# Patient Record
Sex: Female | Born: 1985 | Race: Black or African American | Hispanic: No | Marital: Married | Smoking: Never smoker
Health system: Southern US, Community
[De-identification: ages and names within clinical notes are randomized; demographics above are authoritative.]

## PROBLEM LIST (undated history)

## (undated) DIAGNOSIS — Z789 Other specified health status: Secondary | ICD-10-CM

## (undated) HISTORY — PX: NO PAST SURGERIES: SHX2092

## (undated) HISTORY — PX: WISDOM TOOTH EXTRACTION: SHX21

## (undated) HISTORY — DX: Other specified health status: Z78.9

---

## 2005-01-12 ENCOUNTER — Emergency Department (HOSPITAL_COMMUNITY): Admission: EM | Admit: 2005-01-12 | Discharge: 2005-01-12 | Payer: Self-pay | Admitting: Emergency Medicine

## 2005-08-16 ENCOUNTER — Emergency Department (HOSPITAL_COMMUNITY): Admission: EM | Admit: 2005-08-16 | Discharge: 2005-08-17 | Payer: Self-pay | Admitting: Emergency Medicine

## 2005-12-31 ENCOUNTER — Emergency Department (HOSPITAL_COMMUNITY): Admission: EM | Admit: 2005-12-31 | Discharge: 2005-12-31 | Payer: Self-pay | Admitting: Family Medicine

## 2006-05-23 IMAGING — US US TRANSVAGINAL NON-OB
1 series · 14 of 25 positions shown · non-contrast
Comparison: none

CLINICAL DATA: Pelvic pain.
 TRANSABDOMINAL AND TRANSVAGINAL PELVIC ULTRASOUND:
TECHNIQUE: Both transabdominal and transvaginal ultrasound examinations of the pelvis were performed including evaluation of the uterus, ovaries, adnexal regions, and pelvic cul-de-sac.

[Series 1: unknown · 0.28mm/px · 14 of 49 slices shown]
[im 1/49]
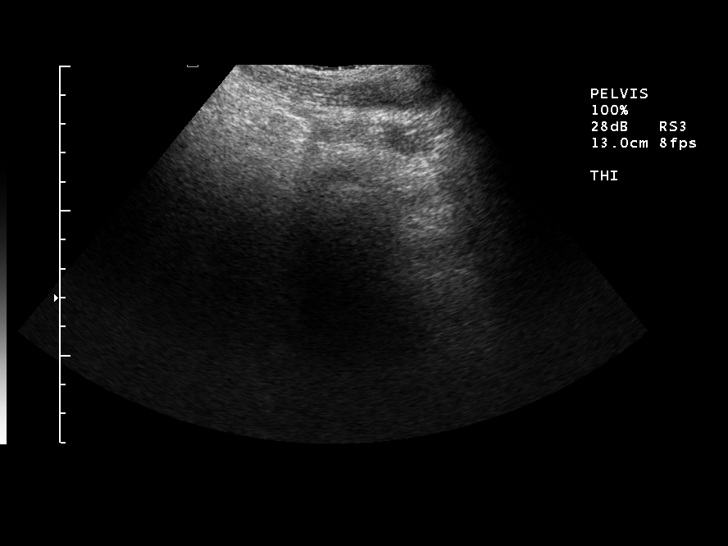
[im 5/49]
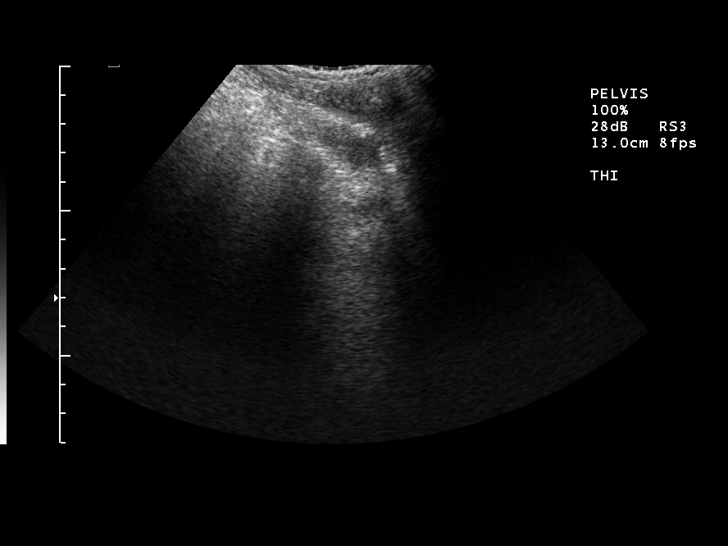
[im 9/49]
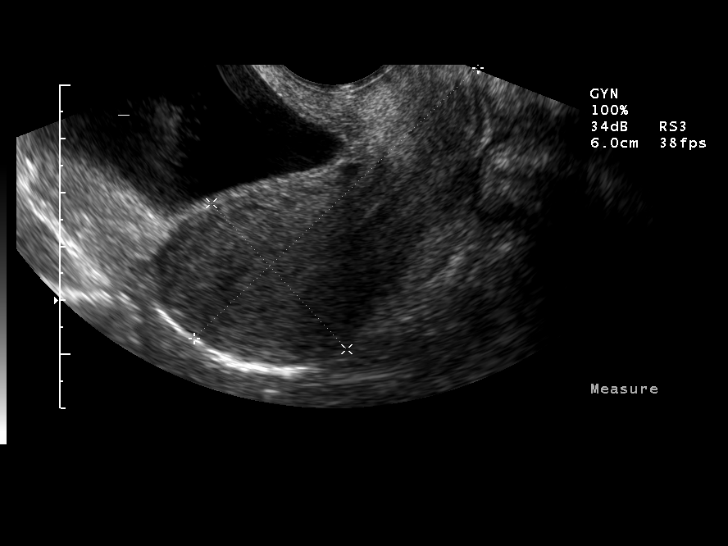
[im 13/49]
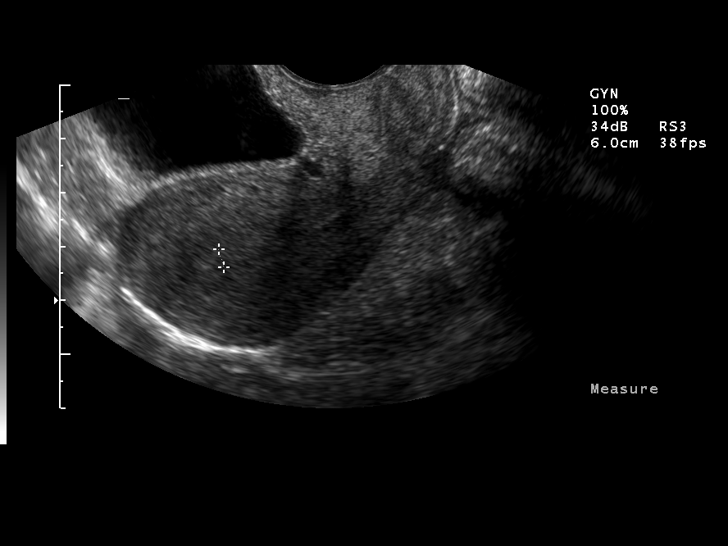
[im 17/49]
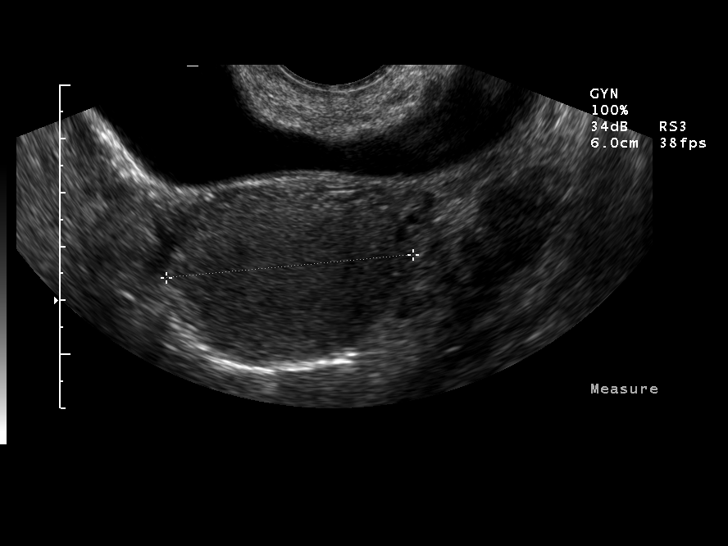
[im 19/49]
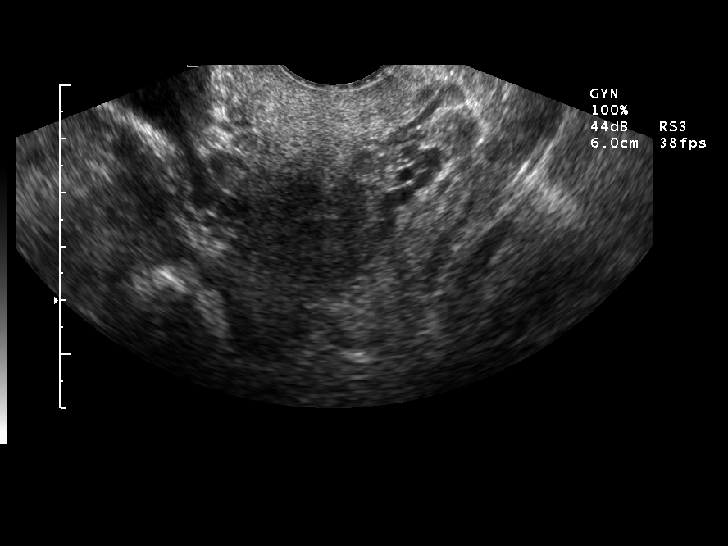
[im 23/49]
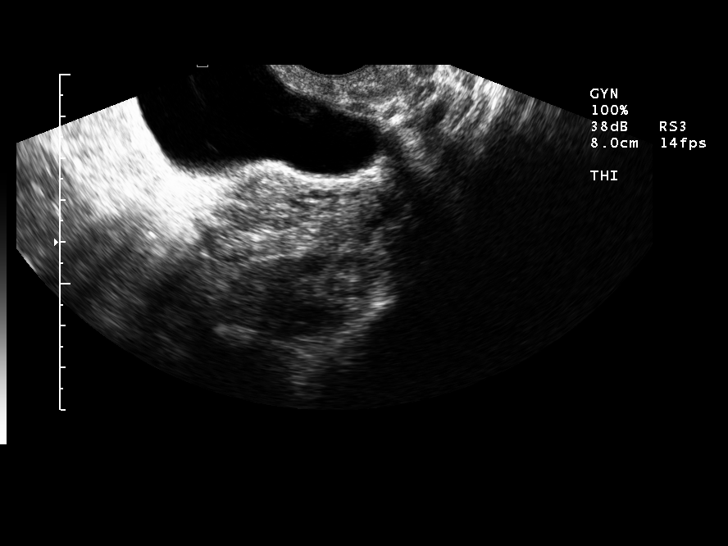
[im 27/49]
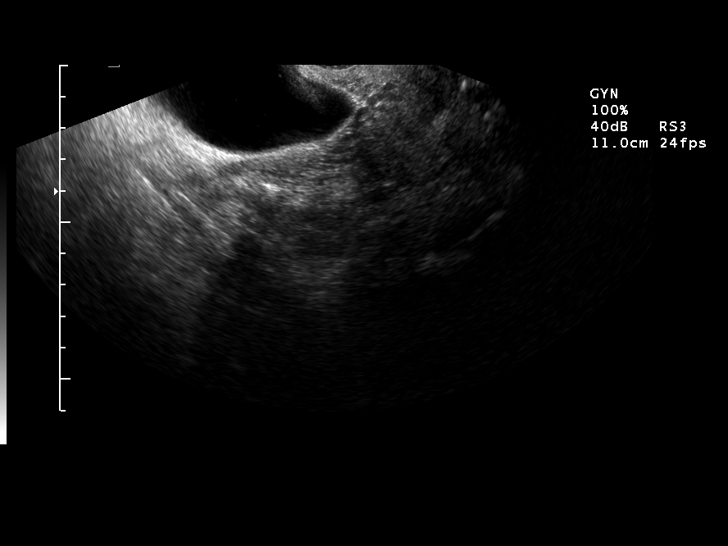
[im 31/49]
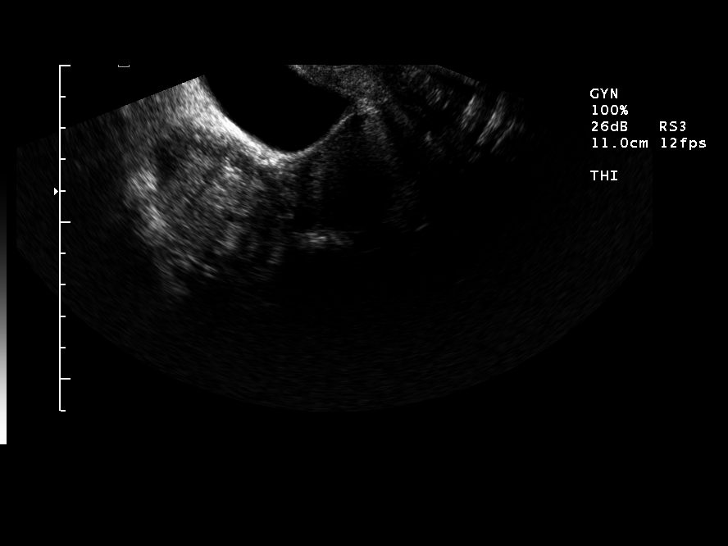
[im 33/49]
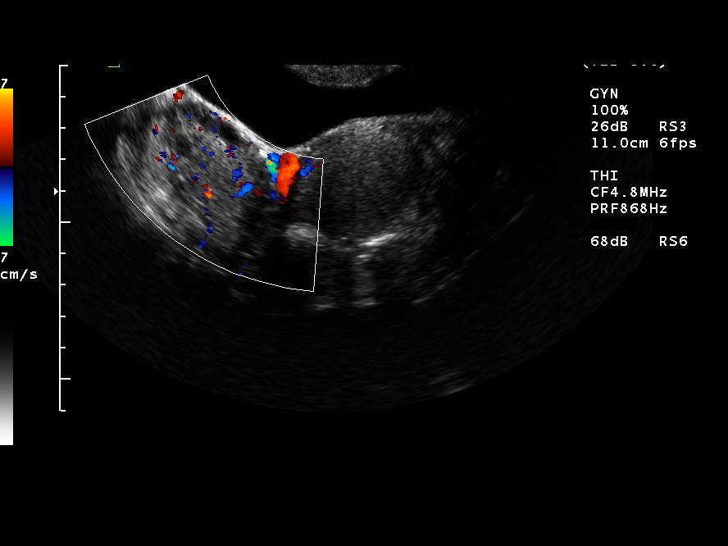
[im 37/49]
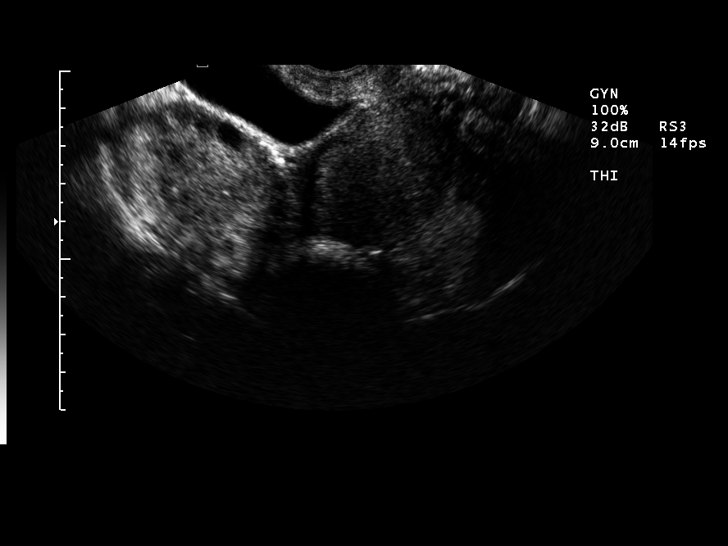
[im 41/49]
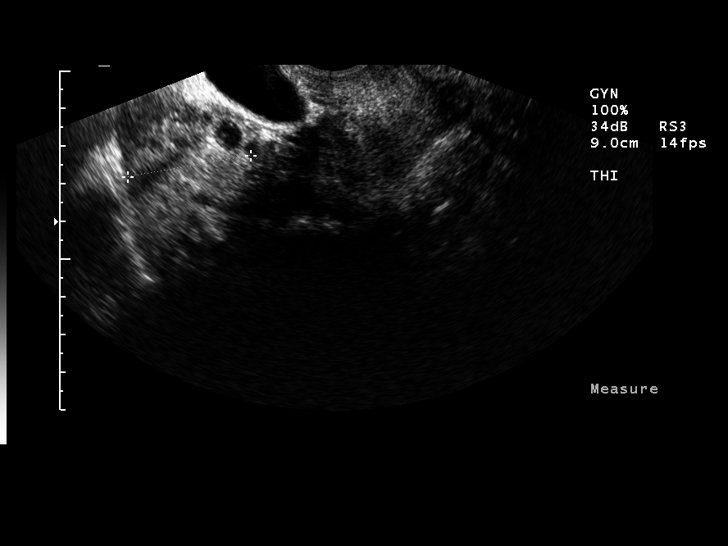
[im 45/49]
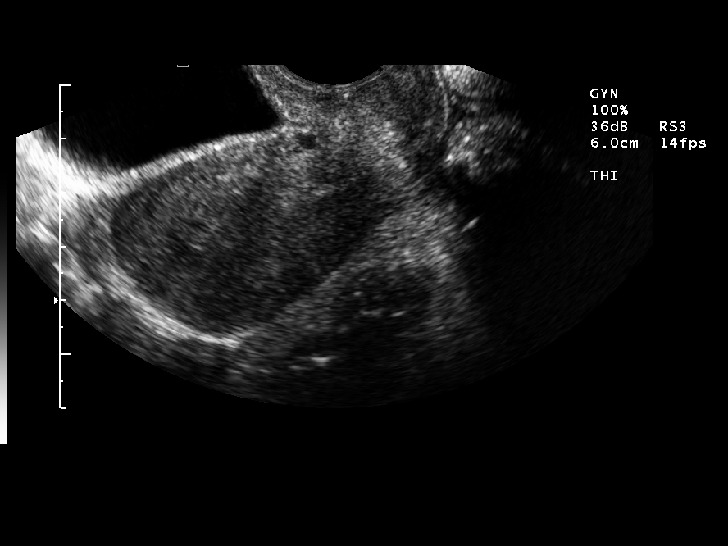
[im 49/49]
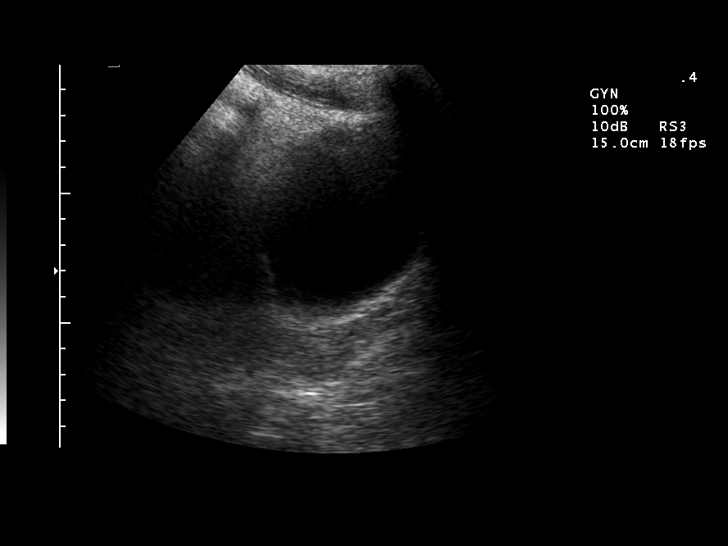

[14 of 25 positions shown; findings below may reference images not displayed]

FINDINGS: The uterus is within normal limits.  The endometrial stripe is 4 mm in caliber.  The ovaries are within normal limits without evidence of abnormal cyst.  Negative free fluid.  No definite hydrosalpinx is present.
IMPRESSION: Pelvic examination is within normal limits.  The abnormal noted on CT in the right adnexa is not appreciated.  It probably does not represent a hydrosalpinx.  However, loculated fluid secondary to other causes are a consideration.  This would include focal abscess or bladder diverticulum.  MRI may be helpful.

## 2007-02-28 ENCOUNTER — Emergency Department (HOSPITAL_COMMUNITY): Admission: EM | Admit: 2007-02-28 | Discharge: 2007-02-28 | Payer: Self-pay | Admitting: Family Medicine

## 2011-11-02 ENCOUNTER — Ambulatory Visit (INDEPENDENT_AMBULATORY_CARE_PROVIDER_SITE_OTHER): Payer: No Typology Code available for payment source

## 2011-11-02 DIAGNOSIS — L738 Other specified follicular disorders: Secondary | ICD-10-CM

## 2011-11-02 DIAGNOSIS — H6500 Acute serous otitis media, unspecified ear: Secondary | ICD-10-CM

## 2011-11-02 DIAGNOSIS — R599 Enlarged lymph nodes, unspecified: Secondary | ICD-10-CM

## 2013-10-18 NOTE — L&D Delivery Note (Signed)
SVD of VFI at 1915 on 09/30/14.  EBL 350cc.  APGARs 8,9.  Placenta to L&D. Head delivered LOA and body followed atraumatically.  Cord was clamped, cut, and baby to abdomen.  Cord blood was obtained.  Placenta delivered S/I/3VC.  Fundus was firmed with pitocin and massage.  Vaginal laceration was repaired with 3-0 Rapide in the normal fashion.  Mom and baby stable.    Mitchel HonourMegan Celso Granja, DO

## 2014-03-26 LAB — OB RESULTS CONSOLE RPR: RPR: NONREACTIVE

## 2014-03-26 LAB — OB RESULTS CONSOLE RUBELLA ANTIBODY, IGM: Rubella: IMMUNE

## 2014-03-26 LAB — OB RESULTS CONSOLE ABO/RH: RH Type: POSITIVE

## 2014-03-26 LAB — OB RESULTS CONSOLE HEPATITIS B SURFACE ANTIGEN: Hepatitis B Surface Ag: NEGATIVE

## 2014-03-26 LAB — OB RESULTS CONSOLE ANTIBODY SCREEN: ANTIBODY SCREEN: NEGATIVE

## 2014-03-26 LAB — OB RESULTS CONSOLE HIV ANTIBODY (ROUTINE TESTING): HIV: NONREACTIVE

## 2014-04-04 LAB — OB RESULTS CONSOLE GC/CHLAMYDIA
Chlamydia: NEGATIVE
GC PROBE AMP, GENITAL: NEGATIVE

## 2014-09-04 ENCOUNTER — Ambulatory Visit (HOSPITAL_COMMUNITY): Payer: 59

## 2014-09-10 LAB — OB RESULTS CONSOLE GBS: GBS: NEGATIVE

## 2014-09-18 ENCOUNTER — Ambulatory Visit (HOSPITAL_COMMUNITY): Payer: 59

## 2014-09-29 ENCOUNTER — Inpatient Hospital Stay (HOSPITAL_COMMUNITY)
Admission: AD | Admit: 2014-09-29 | Discharge: 2014-09-29 | Disposition: A | Discharge status: Home / Self Care | Payer: 59 | Source: Ambulatory Visit | Attending: Obstetrics and Gynecology | Admitting: Obstetrics and Gynecology

## 2014-09-29 ENCOUNTER — Encounter (HOSPITAL_COMMUNITY): Payer: Self-pay | Admitting: *Deleted

## 2014-09-29 ENCOUNTER — Inpatient Hospital Stay (HOSPITAL_COMMUNITY): Payer: 59

## 2014-09-29 DIAGNOSIS — IMO0002 Reserved for concepts with insufficient information to code with codable children: Secondary | ICD-10-CM

## 2014-09-29 DIAGNOSIS — Z3A37 37 weeks gestation of pregnancy: Secondary | ICD-10-CM | POA: Diagnosis not present

## 2014-09-29 DIAGNOSIS — R109 Unspecified abdominal pain: Secondary | ICD-10-CM | POA: Insufficient documentation

## 2014-09-29 DIAGNOSIS — O9989 Other specified diseases and conditions complicating pregnancy, childbirth and the puerperium: Secondary | ICD-10-CM | POA: Diagnosis present

## 2014-09-29 NOTE — Discharge Instructions (Signed)
Braxton Hicks Contractions °Contractions of the uterus can occur throughout pregnancy. Contractions are not always a sign that you are in labor.  °WHAT ARE BRAXTON HICKS CONTRACTIONS?  °Contractions that occur before labor are called Braxton Hicks contractions, or false labor. Toward the end of pregnancy (32-34 weeks), these contractions can develop more often and may become more forceful. This is not true labor because these contractions do not result in opening (dilatation) and thinning of the cervix. They are sometimes difficult to tell apart from true labor because these contractions can be forceful and people have different pain tolerances. You should not feel embarrassed if you go to the hospital with false labor. Sometimes, the only way to tell if you are in true labor is for your health care provider to look for changes in the cervix. °If there are no prenatal problems or other health problems associated with the pregnancy, it is completely safe to be sent home with false labor and await the onset of true labor. °HOW CAN YOU TELL THE DIFFERENCE BETWEEN TRUE AND FALSE LABOR? °False Labor °· The contractions of false labor are usually shorter and not as hard as those of true labor.   °· The contractions are usually irregular.   °· The contractions are often felt in the front of the lower abdomen and in the groin.   °· The contractions may go away when you walk around or change positions while lying down.   °· The contractions get weaker and are shorter lasting as time goes on.   °· The contractions do not usually become progressively stronger, regular, and closer together as with true labor.   °True Labor °· Contractions in true labor last 30-70 seconds, become very regular, usually become more intense, and increase in frequency.   °· The contractions do not go away with walking.   °· The discomfort is usually felt in the top of the uterus and spreads to the lower abdomen and low back.   °· True labor can be  determined by your health care provider with an exam. This will show that the cervix is dilating and getting thinner.   °WHAT TO REMEMBER °· Keep up with your usual exercises and follow other instructions given by your health care provider.   °· Take medicines as directed by your health care provider.   °· Keep your regular prenatal appointments.   °· Eat and drink lightly if you think you are going into labor.   °· If Braxton Hicks contractions are making you uncomfortable:   °¨ Change your position from lying down or resting to walking, or from walking to resting.   °¨ Sit and rest in a tub of warm water.   °¨ Drink 2-3 glasses of water. Dehydration may cause these contractions.   °¨ Do slow and deep breathing several times an hour.   °WHEN SHOULD I SEEK IMMEDIATE MEDICAL CARE? °Seek immediate medical care if: °· Your contractions become stronger, more regular, and closer together.   °· You have fluid leaking or gushing from your vagina.   °· You have a fever.   °· You pass blood-tinged mucus.   °· You have vaginal bleeding.   °· You have continuous abdominal pain.   °· You have low back pain that you never had before.   °· You feel your baby's head pushing down and causing pelvic pressure.   °· Your baby is not moving as much as it used to.   °Document Released: 10/04/2005 Document Revised: 10/09/2013 Document Reviewed: 07/16/2013 °[employer]® Patient Information ©2015 [employer]. This information is not intended to replace advice given to you by your health care   provider. Make sure you discuss any questions you have with your health care provider. ° °

## 2014-09-29 NOTE — MAU Note (Signed)
Pt reports what she states feels like cramping that comes and goes. Denies bleeding or ROM

## 2014-09-30 ENCOUNTER — Inpatient Hospital Stay (HOSPITAL_COMMUNITY)
Admission: AD | Admit: 2014-09-30 | Discharge: 2014-10-02 | DRG: 775 | Disposition: A | Discharge status: Home / Self Care | Payer: 59 | Source: Ambulatory Visit | Attending: Obstetrics & Gynecology | Admitting: Obstetrics & Gynecology

## 2014-09-30 ENCOUNTER — Inpatient Hospital Stay (HOSPITAL_COMMUNITY): Payer: 59 | Admitting: Anesthesiology

## 2014-09-30 ENCOUNTER — Encounter (HOSPITAL_COMMUNITY): Payer: Self-pay

## 2014-09-30 DIAGNOSIS — Z3490 Encounter for supervision of normal pregnancy, unspecified, unspecified trimester: Secondary | ICD-10-CM

## 2014-09-30 DIAGNOSIS — Z3A38 38 weeks gestation of pregnancy: Secondary | ICD-10-CM | POA: Diagnosis present

## 2014-09-30 DIAGNOSIS — Z3403 Encounter for supervision of normal first pregnancy, third trimester: Secondary | ICD-10-CM | POA: Diagnosis present

## 2014-09-30 LAB — TYPE AND SCREEN
ABO/RH(D): O POS
ANTIBODY SCREEN: NEGATIVE

## 2014-09-30 LAB — CBC
HCT: 37.3 % (ref 36.0–46.0)
Hemoglobin: 12.2 g/dL (ref 12.0–15.0)
MCH: 29.3 pg (ref 26.0–34.0)
MCHC: 32.7 g/dL (ref 30.0–36.0)
MCV: 89.7 fL (ref 78.0–100.0)
PLATELETS: 272 10*3/uL (ref 150–400)
RBC: 4.16 MIL/uL (ref 3.87–5.11)
RDW: 13.5 % (ref 11.5–15.5)
WBC: 20.3 10*3/uL — ABNORMAL HIGH (ref 4.0–10.5)

## 2014-09-30 LAB — RPR

## 2014-09-30 LAB — ABO/RH: ABO/RH(D): O POS

## 2014-09-30 MED ORDER — SIMETHICONE 80 MG PO CHEW: 80.0000 mg | CHEWABLE_TABLET | ORAL | Status: DC | PRN

## 2014-09-30 MED ORDER — ONDANSETRON HCL 4 MG/2ML IJ SOLN: 4.0000 mg | Freq: Four times a day (QID) | INTRAMUSCULAR | Status: DC | PRN

## 2014-09-30 MED ORDER — PHENYLEPHRINE 40 MCG/ML (10ML) SYRINGE FOR IV PUSH (FOR BLOOD PRESSURE SUPPORT)
PREFILLED_SYRINGE | INTRAVENOUS | Status: AC
  Filled 2014-09-30: qty 10

## 2014-09-30 MED ORDER — WITCH HAZEL-GLYCERIN EX PADS: 1.0000 "application " | MEDICATED_PAD | CUTANEOUS | Status: DC | PRN

## 2014-09-30 MED ORDER — TETANUS-DIPHTH-ACELL PERTUSSIS 5-2.5-18.5 LF-MCG/0.5 IM SUSP: 0.5000 mL | Freq: Once | INTRAMUSCULAR | Status: DC

## 2014-09-30 MED ORDER — PRENATAL MULTIVITAMIN CH
1.0000 | ORAL_TABLET | Freq: Every day | ORAL | Status: DC
  Administered 2014-10-01 – 2014-10-02 (×2): 1 via ORAL
  Filled 2014-09-30 (×2): qty 1

## 2014-09-30 MED ORDER — OXYTOCIN BOLUS FROM INFUSION: 500.0000 mL | INTRAVENOUS | Status: DC

## 2014-09-30 MED ORDER — FLEET ENEMA 7-19 GM/118ML RE ENEM: 1.0000 | ENEMA | RECTAL | Status: DC | PRN

## 2014-09-30 MED ORDER — LACTATED RINGERS IV SOLN
500.0000 mL | Freq: Once | INTRAVENOUS | Status: AC
  Administered 2014-09-30: 500 mL via INTRAVENOUS

## 2014-09-30 MED ORDER — LACTATED RINGERS IV SOLN: INTRAVENOUS | Status: DC

## 2014-09-30 MED ORDER — PHENYLEPHRINE 40 MCG/ML (10ML) SYRINGE FOR IV PUSH (FOR BLOOD PRESSURE SUPPORT)
80.0000 ug | PREFILLED_SYRINGE | INTRAVENOUS | Status: DC | PRN
  Filled 2014-09-30: qty 2

## 2014-09-30 MED ORDER — OXYCODONE-ACETAMINOPHEN 5-325 MG PO TABS: 2.0000 | ORAL_TABLET | ORAL | Status: DC | PRN

## 2014-09-30 MED ORDER — LACTATED RINGERS IV SOLN: 500.0000 mL | INTRAVENOUS | Status: DC | PRN

## 2014-09-30 MED ORDER — ZOLPIDEM TARTRATE 5 MG PO TABS: 5.0000 mg | ORAL_TABLET | Freq: Every evening | ORAL | Status: DC | PRN

## 2014-09-30 MED ORDER — OXYCODONE-ACETAMINOPHEN 5-325 MG PO TABS
1.0000 | ORAL_TABLET | ORAL | Status: DC | PRN
  Administered 2014-10-01 – 2014-10-02 (×4): 1 via ORAL
  Filled 2014-09-30 (×4): qty 1

## 2014-09-30 MED ORDER — OXYCODONE-ACETAMINOPHEN 5-325 MG PO TABS: 1.0000 | ORAL_TABLET | ORAL | Status: DC | PRN

## 2014-09-30 MED ORDER — LIDOCAINE HCL (PF) 1 % IJ SOLN: 30.0000 mL | INTRAMUSCULAR | Status: DC | PRN

## 2014-09-30 MED ORDER — OXYTOCIN BOLUS FROM INFUSION
500.0000 mL | INTRAVENOUS | Status: DC
  Administered 2014-09-30: 500 mL via INTRAVENOUS

## 2014-09-30 MED ORDER — LANOLIN HYDROUS EX OINT: TOPICAL_OINTMENT | CUTANEOUS | Status: DC | PRN

## 2014-09-30 MED ORDER — EPHEDRINE 5 MG/ML INJ
10.0000 mg | INTRAVENOUS | Status: DC | PRN
  Filled 2014-09-30: qty 2

## 2014-09-30 MED ORDER — ONDANSETRON HCL 4 MG/2ML IJ SOLN: 4.0000 mg | INTRAMUSCULAR | Status: DC | PRN

## 2014-09-30 MED ORDER — DIBUCAINE 1 % RE OINT: 1.0000 "application " | TOPICAL_OINTMENT | RECTAL | Status: DC | PRN

## 2014-09-30 MED ORDER — DIPHENHYDRAMINE HCL 50 MG/ML IJ SOLN: 12.5000 mg | INTRAMUSCULAR | Status: DC | PRN

## 2014-09-30 MED ORDER — OXYTOCIN 40 UNITS IN LACTATED RINGERS INFUSION - SIMPLE MED: 62.5000 mL/h | INTRAVENOUS | Status: DC

## 2014-09-30 MED ORDER — ONDANSETRON HCL 4 MG PO TABS: 4.0000 mg | ORAL_TABLET | ORAL | Status: DC | PRN

## 2014-09-30 MED ORDER — LIDOCAINE HCL (PF) 1 % IJ SOLN
INTRAMUSCULAR | Status: DC | PRN
  Administered 2014-09-30 (×2): 5 mL

## 2014-09-30 MED ORDER — DIPHENHYDRAMINE HCL 25 MG PO CAPS: 25.0000 mg | ORAL_CAPSULE | Freq: Four times a day (QID) | ORAL | Status: DC | PRN

## 2014-09-30 MED ORDER — FENTANYL 2.5 MCG/ML BUPIVACAINE 1/10 % EPIDURAL INFUSION (WH - ANES)
14.0000 mL/h | INTRAMUSCULAR | Status: DC | PRN
  Administered 2014-09-30 (×2): 14 mL/h via EPIDURAL
  Filled 2014-09-30: qty 125

## 2014-09-30 MED ORDER — SENNOSIDES-DOCUSATE SODIUM 8.6-50 MG PO TABS
2.0000 | ORAL_TABLET | ORAL | Status: DC
  Administered 2014-10-01 (×2): 2 via ORAL
  Filled 2014-09-30 (×2): qty 2

## 2014-09-30 MED ORDER — TERBUTALINE SULFATE 1 MG/ML IJ SOLN: 0.2500 mg | Freq: Once | INTRAMUSCULAR | Status: DC | PRN

## 2014-09-30 MED ORDER — EPHEDRINE 5 MG/ML INJ: 10.0000 mg | INTRAVENOUS | Status: DC | PRN

## 2014-09-30 MED ORDER — LACTATED RINGERS IV SOLN
INTRAVENOUS | Status: DC
  Administered 2014-09-30 (×2): 125 mL/h via INTRAVENOUS

## 2014-09-30 MED ORDER — OXYCODONE-ACETAMINOPHEN 5-325 MG PO TABS
2.0000 | ORAL_TABLET | ORAL | Status: DC | PRN
Start: 2014-09-30 — End: 2014-09-30

## 2014-09-30 MED ORDER — OXYTOCIN 40 UNITS IN LACTATED RINGERS INFUSION - SIMPLE MED
1.0000 m[IU]/min | INTRAVENOUS | Status: DC
  Administered 2014-09-30: 2 m[IU]/min via INTRAVENOUS
  Filled 2014-09-30: qty 1000

## 2014-09-30 MED ORDER — CITRIC ACID-SODIUM CITRATE 334-500 MG/5ML PO SOLN: 30.0000 mL | ORAL | Status: DC | PRN

## 2014-09-30 MED ORDER — IBUPROFEN 600 MG PO TABS
600.0000 mg | ORAL_TABLET | Freq: Four times a day (QID) | ORAL | Status: DC
  Administered 2014-10-01 – 2014-10-02 (×7): 600 mg via ORAL
  Filled 2014-09-30 (×7): qty 1

## 2014-09-30 MED ORDER — FENTANYL 2.5 MCG/ML BUPIVACAINE 1/10 % EPIDURAL INFUSION (WH - ANES)
INTRAMUSCULAR | Status: AC
  Administered 2014-09-30: 14 mL/h via EPIDURAL
  Filled 2014-09-30: qty 125

## 2014-09-30 MED ORDER — ACETAMINOPHEN 325 MG PO TABS: 650.0000 mg | ORAL_TABLET | ORAL | Status: DC | PRN

## 2014-09-30 MED ORDER — LIDOCAINE HCL (PF) 1 % IJ SOLN
30.0000 mL | INTRAMUSCULAR | Status: DC | PRN
  Filled 2014-09-30: qty 30

## 2014-09-30 MED ORDER — FENTANYL 2.5 MCG/ML BUPIVACAINE 1/10 % EPIDURAL INFUSION (WH - ANES): 14.0000 mL/h | INTRAMUSCULAR | Status: DC | PRN

## 2014-09-30 MED ORDER — BENZOCAINE-MENTHOL 20-0.5 % EX AERO
1.0000 "application " | INHALATION_SPRAY | CUTANEOUS | Status: DC | PRN
  Administered 2014-10-01: 1 via TOPICAL
  Filled 2014-09-30: qty 56

## 2014-09-30 MED ORDER — PHENYLEPHRINE 40 MCG/ML (10ML) SYRINGE FOR IV PUSH (FOR BLOOD PRESSURE SUPPORT): 80.0000 ug | PREFILLED_SYRINGE | INTRAVENOUS | Status: DC | PRN

## 2014-09-30 NOTE — Plan of Care (Signed)
Problem: Consults Goal: Birthing Suites Patient Information Press F2 to bring up selections list  Outcome: Completed/Met Date Met:  09/30/14  Pt 37-[redacted] weeks EGA  Problem: Phase I Progression Outcomes Goal: Pain controlled with appropriate interventions Outcome: Completed/Met Date Met:  09/30/14 Epidural in place Goal: Tolerating diet Outcome: Completed/Met Date Met:  09/30/14 Clear Liquid

## 2014-09-30 NOTE — Anesthesia Preprocedure Evaluation (Signed)

## 2014-09-30 NOTE — Anesthesia Procedure Notes (Signed)
Epidural Patient location during procedure: OB Start time: 09/30/2014 10:07 AM  Staffing Anesthesiologist: Brayton CavesJACKSON, Tesslyn Baumert Performed by: anesthesiologist   Preanesthetic Checklist Completed: patient identified, site marked, surgical consent, pre-op evaluation, timeout performed, IV checked, risks and benefits discussed and monitors and equipment checked  Epidural Patient position: sitting Prep: site prepped and draped and DuraPrep Patient monitoring: continuous pulse ox and blood pressure Approach: midline Location: L2-L3 Injection technique: LOR air  Needle:  Needle type: Tuohy  Needle gauge: 17 G Needle length: 9 cm and 9 Needle insertion depth: 5 cm cm Catheter type: closed end flexible Catheter size: 19 Gauge Catheter at skin depth: 10 cm Test dose: negative  Assessment Events: blood not aspirated, injection not painful, no injection resistance, negative IV test and no paresthesia  Additional Notes Patient identified.  Risk benefits discussed including failed block, incomplete pain control, headache, nerve damage, paralysis, blood pressure changes, nausea, vomiting, reactions to medication both toxic or allergic, and postpartum back pain.  Patient expressed understanding and wished to proceed.  All questions were answered.  Sterile technique used throughout procedure and epidural site dressed with sterile barrier dressing. No paresthesia or other complications noted.The patient did not experience any signs of intravascular injection such as tinnitus or metallic taste in mouth nor signs of intrathecal spread such as rapid motor block. Please see nursing notes for vital signs.

## 2014-09-30 NOTE — MAU Note (Signed)
Pt. Complaint of ctx since 0200 this am. States ctx are every 5 mins.

## 2014-09-30 NOTE — Progress Notes (Signed)
RN notified Anesthesia of patient's continuing numbness. Anesthesia relayed the need to keep patient on L&D while allowing her more time to regain feeling.

## 2014-09-30 NOTE — H&P (Signed)
Janet Hood is a 28 y.o. female presenting for labor.  CTX started at 2am.  No LOF.  +FM.  Antepartum course uncomplicated.    Maternal Medical History:  Reason for admission: Contractions.   Contractions: Onset was 6-12 hours ago.   Frequency: regular.   Perceived severity is moderate.    Fetal activity: Perceived fetal activity is normal.   Last perceived fetal movement was within the past hour.    Prenatal complications: no prenatal complications Prenatal Complications - Diabetes: none.    OB History    Gravida Para Term Preterm AB TAB SAB Ectopic Multiple Living   1 0 0 0 0 0 0 0 0 0      History reviewed. No pertinent past medical history. Past Surgical History  Procedure Laterality Date  . Wisdom tooth extraction    . No past surgeries     Family History: family history is not on file. Social History:  reports that she has never smoked. She does not have any smokeless tobacco history on file. She reports that she drinks about 0.6 oz of alcohol per week. She reports that she does not use illicit drugs.   Prenatal Transfer Tool  Maternal Diabetes: No Genetic Screening: Normal Maternal Ultrasounds/Referrals: Normal Fetal Ultrasounds or other Referrals:  None Maternal Substance Abuse:  No Significant Maternal Medications:  None Significant Maternal Lab Results:  Lab values include: Group B Strep negative Other Comments:  None  ROS  Dilation: 6 Effacement (%): 100 Station: -2 Exam by:: Amy Beard RNC-OB, BSN Blood pressure 113/73, pulse 79, temperature 97.6 F (36.4 C), temperature source Oral, resp. rate 18. Maternal Exam:  Uterine Assessment: Contraction strength is moderate.  Contraction frequency is regular.   Abdomen: Patient reports no abdominal tenderness. Fundal height is c/w dates.   Estimated fetal weight is 7#4.       Physical Exam  Constitutional: She is oriented to person, place, and time. She appears well-developed and well-nourished.  GI:  Soft. There is no rebound and no guarding.  Neurological: She is alert and oriented to person, place, and time.  Skin: Skin is warm and dry.  Psychiatric: She has a normal mood and affect. Her behavior is normal.    Prenatal labs: ABO, Rh: O/Positive/-- (06/09 0000) Antibody: Negative (06/09 0000) Rubella: Immune (06/09 0000) RPR: Nonreactive (06/09 0000)  HBsAg: Negative (06/09 0000)  HIV: Non-reactive (06/09 0000)  GBS: Negative (11/24 0000)   Assessment/Plan: 28yo G1 at 4675w0d with labor -Anticipate NSVD -Epidural   Lovina Zuver 09/30/2014, 9:32 AM

## 2014-09-30 NOTE — Progress Notes (Signed)
Dr Ellyn HackBovard notified of pt's VE, contraction pattern, and FHR, orders received

## 2014-09-30 NOTE — Progress Notes (Signed)
RN updated Anesthesia of patient's numbness - RN reported patient improvement. [facility]esthesia reported feeling [facility]

## 2014-10-01 LAB — CBC
HCT: 32.6 % — ABNORMAL LOW (ref 36.0–46.0)
Hemoglobin: 10.5 g/dL — ABNORMAL LOW (ref 12.0–15.0)
MCH: 28.9 pg (ref 26.0–34.0)
MCHC: 32.2 g/dL (ref 30.0–36.0)
MCV: 89.8 fL (ref 78.0–100.0)
Platelets: 240 10*3/uL (ref 150–400)
RBC: 3.63 MIL/uL — AB (ref 3.87–5.11)
RDW: 13.7 % (ref 11.5–15.5)
WBC: 19.6 10*3/uL — ABNORMAL HIGH (ref 4.0–10.5)

## 2014-10-01 MED ORDER — INFLUENZA VAC SPLIT QUAD 0.5 ML IM SUSY: 0.5000 mL | PREFILLED_SYRINGE | INTRAMUSCULAR | Status: DC

## 2014-10-01 NOTE — Lactation Note (Signed)
This note was copied from the chart of Janet Shalana Ruhlman. Lactation Consultation Note  Consult attempted on this first time BF mother.  She told me that BF was going well and that the baby didn't like"it".  She mentioned how sleepy the baby was and that the nurse had been assisting her.  I reassured her that the baby's behavior was WNL.  I explained to her that if her labor was Bertone that this could affect how alert her baby was.  She stated understanding.  She will call for help if she desires it.   Patient Name: Janet Hood Today's Date: 10/01/2014     Maternal Data    Feeding    LATCH Score/Interventions Latch: Too sleepy or reluctant, no latch achieved, no sucking elicited.                    Lactation Tools Discussed/Used     Consult Status      Soyla DryerJoseph, Arantxa Piercey 10/01/2014, 5:27 PM

## 2014-10-01 NOTE — Progress Notes (Cosign Needed)
Post Partum Day 1 Subjective: no complaints, up ad lib, voiding, tolerating PO and + flatus  Objective: Blood pressure 103/61, pulse 74, temperature 98.2 F (36.8 C), temperature source Oral, resp. rate 18, height 5\' 9"  (1.753 m), weight 155 lb (70.308 kg), SpO2 75 %, unknown if currently breastfeeding.  Physical Exam:  General: alert and cooperative Lochia: appropriate Uterine Fundus: firm Incision: healing well DVT Evaluation: No evidence of DVT seen on physical exam. Negative Homan's sign. No cords or calf tenderness. No significant calf/ankle edema.   Recent Labs  09/30/14 0900 10/01/14 0530  HGB 12.2 10.5*  HCT 37.3 32.6*    Assessment/Plan: Plan for discharge tomorrow   LOS: 1 day   Janet Hood G 10/01/2014, 8:15 AM

## 2014-10-01 NOTE — Anesthesia Postprocedure Evaluation (Signed)
Anesthesia Post Note  Patient: Janet Hood  Procedure(s) Performed: * No procedures listed *  Anesthesia type: Epidural  Patient location: Mother/Baby  Post pain: Pain level controlled  Post assessment: Post-op Vital signs reviewed  Last Vitals:  Filed Vitals:   10/01/14 0502  BP: 103/61  Pulse: 74  Temp: 36.8 C  Resp: 18    Post vital signs: Reviewed  Level of consciousness:alert  Complications: No apparent anesthesia complications

## 2014-10-02 MED ORDER — OXYCODONE-ACETAMINOPHEN 5-325 MG PO TABS: 1.0000 | ORAL_TABLET | ORAL | Status: DC | PRN

## 2014-10-02 MED ORDER — IBUPROFEN 600 MG PO TABS: 600.0000 mg | ORAL_TABLET | Freq: Four times a day (QID) | ORAL | Status: DC

## 2014-10-02 NOTE — Lactation Note (Signed)
This note was copied from the chart of Girl Doraine Smithey. Lactation Consultation Note  Patient Name: Girl Elenora FenderDeidra Nesler Today's Date: 10/02/2014 Reason for consult: Follow-up assessment;Difficult latch Mom is bottle feeding because baby will not latch. Mom is interested in getting baby to breast but if not would like to pump/bottle feed. Baby recently had bottle and asleep at this visit. Plan for now is Mom will pump/bottle, she has her own Medela DEBP. Mom reports she will continue to work at the breast as well. LC advised Mom to pump every 3 hours for 15-20 minutes, storage guidelines reviewed with Mom. OP f/u scheduled for Tuesday, 10/08/14 at 1:00 to help Mom work baby back to breast once her milk comes in if she is not successful between now and then. Engorgement care reviewed if needed.   Maternal Data    Feeding Feeding Type: Formula  LATCH Score/Interventions                      Lactation Tools Discussed/Used Tools: Pump Breast pump type: Double-Electric Breast Pump (has own DEBP)   Consult Status Consult Status: Complete    Alfred LevinsGranger, Sumayah Bearse Ann 10/02/2014, 10:17 AM

## 2014-10-02 NOTE — Discharge Summary (Signed)
Obstetric Discharge Summary Reason for Admission: onset of labor Prenatal Procedures: ultrasound Intrapartum Procedures: spontaneous vaginal delivery Postpartum Procedures: none Complications-Operative and Postpartum: vaginal laceration HEMOGLOBIN  Date Value Ref Range Status  10/01/2014 10.5* 12.0 - 15.0 g/dL Final   HCT  Date Value Ref Range Status  10/01/2014 32.6* 36.0 - 46.0 % Final    Physical Exam:  General: alert and cooperative Lochia: appropriate Uterine Fundus: firm Incision: healing well DVT Evaluation: No evidence of DVT seen on physical exam. Negative Homan's sign. No cords or calf tenderness. No significant calf/ankle edema.  Discharge Diagnoses: Term Pregnancy-delivered  Discharge Information: Date: 10/02/2014 Activity: pelvic rest Diet: routine Medications: PNV, Ibuprofen and Percocet Condition: stable Instructions: refer to practice specific booklet Discharge to: home   Newborn Data: Live born female  Birth Weight: 7 lb 11.1 oz (3490 g) APGAR: 8, 9  Home with mother.  Cord [name] 10/02/2014, 8:26 AM

## 2014-10-08 ENCOUNTER — Ambulatory Visit (HOSPITAL_COMMUNITY)
Admit: 2014-10-08 | Discharge: 2014-10-08 | Disposition: A | Discharge status: Home / Self Care | Payer: 59 | Attending: Obstetrics and Gynecology | Admitting: Obstetrics and Gynecology

## 2014-10-08 NOTE — Lactation Note (Signed)
Lactation Consult  Mother's reason for visit:   Help with latch. Mom reports she feels like she has successfully breastfed 3 times. Janet Hood now 378 days old.  Visit Type:  Feeding assessment/difficult latch - Outpatient Appointment Notes:  Mom reports baby has not been latching. She tries but baby will not latch and suckle. Mom has been pump and bottle feeding but would like baby to latch. Consult:  Initial Lactation Consultant:  Alfred LevinsGranger, Elihue Ebert Ann  ________________________________________________________________________  Baby's Name: Janet Hood Date of Birth: 09/30/2014 Pediatrician: [facility]. [facility] Gender: female Gestational Age: 6560w0d (At Birth) Birth Weight: 7 lb 11.1 oz (3490 g) Weight at Discharge: Weight: 7 lb 7.9 oz (3400 g)Date [facility]: [facility]02/2014 [facility]   09/30/14 1915 10/02/14 0028  Weight: 7 lb 11.1 oz (3490 g) 7 lb 7.9 oz (3400 g)   [facility] - 7 lb. 7 oz. Location:[facility] Weight today: 3470 gm/ 7 lb. 10.4 oz   ________________________________________________________________________  Mother's Name: Janet Hood Type of delivery:  SVB Breastfeeding Experience:  P1 Maternal Medical Conditions:  Denies History Maternal Medications:  Motrin prn, Percocet - prn, PNV/Iron  ________________________________________________________________________  Breastfeeding History (Post Discharge)  Frequency of breastfeeding:  Currently baby is not latching Duration of feeding:  N/A  Supplementation  Formula:  Volume 60-90 ml Frequency:  3 times/day Total volume per day:  180-270 ml       Brand: Enfamil  Breastmilk:  Volume 60-90 ml Frequency:  3 times/day Total volume per day:  180-270 ml  Method:  Bottle,   Pumping  Type of pump:  Medela pump in style Frequency:  2-3 times/day Volume:  150 ml  Total  Infant Intake and Output Assessment  Voids:  4-5  in 24 hrs.  Color:  Clear yellow Stools:  2-3 in 24 hrs.  Color:  Brown  ________________________________________________________________________  Maternal Breast Assessment  Breast:  Engorged Left breast, right breast - full Nipple:  Erect Pain level:  0 Pain interventions:  N/A  _______________________________________________________________________ Feeding Assessment/Evaluation  Initial feeding assessment:  Infant's oral assessment:  Variance. Baby is noted to have short posterior frenulum. Restriction of tongue movement from side to side, upward mobility. Dimpling noted end of tongue with crying and extension.  Positioning:  Cross cradle Left breast  LATCH documentation:  Latch:  1 = Repeated attempts needed to sustain latch, nipple held in mouth throughout feeding, stimulation needed to elicit sucking reflex. Initiated #20 nipple shield for baby to latch.   Audible swallowing:  2 = Spontaneous and intermittent  Type of nipple:  2 = Everted at rest and after stimulation  Comfort (Breast/Nipple):  0 = Engorged, cracked, bleeding, large blisters, severe discomfort. Left breast full with golf ball size firm nodule and left axilla.   Hold (Positioning):  1 = Assistance needed to correctly position infant at breast and maintain latch  LATCH score:  6  Attached assessment:  Deep with using the nipple shield  Lips flanged:  No. Demonstrated how to bring bottom lip down.   Lips untucked:  Off/on  Suck assessment:  Nutritive  Tools:  Nipple shield 20 mm Instructed on use and cleaning of tool:  Yes.    Pre-feed weight:  3470 g  (7 lb. 10.4 oz.) with clean diaper, no clothes Post-feed weight:  3510 g (7 lb. 11.8 oz.) Amount transferred:  40  ml with nursing on left breast for 15 minutes. Initially could not latch  without the nipple shield. With nipple shield baby demonstrated a good rhythmic suck, breast milk in the nipple shield. After 8 minutes removed the nipple shield and baby  was able to re-latch with sandwiching the nipple/aerola to finish the feeding. Nodule under left axilla softer with nursing, massage with nursing.  Amount supplemented:  0 ml  Additional Feeding Assessment -   Infant's oral assessment:  Variance  Positioning:  Cross cradle Right breast  LATCH documentation:  Latch:  1 = Repeated attempts needed to sustain latch, nipple held in mouth throughout feeding, stimulation needed to elicit sucking reflex. Initially sleepy and would not latch but once re-dressed baby awake and Mom latched baby using the nipple shield #20. LC demonstrated again how to bring bottom lip down.   Audible swallowing:  2 = Spontaneous and intermittent  Type of nipple:  2 = Everted at rest and after stimulation  Comfort (Breast/Nipple):  0 = Engorged, cracked, bleeding, large blisters, severe discomfort. Firm nodule present at right axilla. Massage with breastfeeding. Softened but not resolved with breastfeeding.  Hold (Positioning):  1 = Assistance needed to correctly position infant at breast and maintain latch  LATCH score:  6  Attached assessment:  Deep once bottom lip brought down   Lips flanged:  Off/on  Lips untucked:  Off/on  Suck assessment:  Nutritive  Tools:  Nipple shield 20 mm Instructed on use and cleaning of tool:  Yes.    Pre-feed weight:  3568 g  (7 lb. 13.9 oz.)  Fully dressed Post-feed weight:  3584 g (7 lb. 14.4 oz.) Amount transferred:  16 ml with nursing on right breast with #20 nipple shield for 11 minutes. Firm nodules present under right axilla, softening with nursing/pumping, warm compresses applied before pumping with good massage.  Amount supplemented:  0 ml   Total amount pumped post feed:  R 13 ml    L 15 ml  Total amount transferred:   56 ml Total supplement given:  0 ml  Stressed importance to Mom of breasts being emptied on a regular basis to prevent clogged milk ducts, mastitis and to protect milk supply.  Plan discussed with  Mom: Breastfeed whenever baby is hungry but at least every 3 hours. Pre-pump for 3-5 minutes to soften nipple/aerola to help with latch. Latch baby using #20 nipple shield. After baby develops a good suckling rhythm and has been nursing for 5-10 minutes, Mom can try to remove nipple shield and re-latch. Be sure you have good depth without nipple shield. Keep baby nursing for 15-20 minutes both breasts each feeding. Post pump if nodules present in breast or if baby does not breastfeed to soften breasts.  Breasts need to be emptied every 3 hours to prevent engorgement till nodules resolved under arms - apply ice as needed after nursing/pumping.  May need warm compresses prior to latching baby with massage to work out firm nodules that are present Motrin prn. Information given to parents to research "tongue-tie". Advised to review Dr. Earvin HansenBobby Ghaheri's web site.  Consult with Peds for referral if interested in revision.  OP f/u scheduled for Tuesday, 10/15/14 at 1:00.

## 2014-10-15 ENCOUNTER — Ambulatory Visit (HOSPITAL_COMMUNITY)
Admission: RE | Admit: 2014-10-15 | Discharge: 2014-10-15 | Disposition: A | Discharge status: Home / Self Care | Payer: 59 | Source: Ambulatory Visit | Attending: Obstetrics and Gynecology | Admitting: Obstetrics and Gynecology

## 2014-10-15 NOTE — Lactation Note (Signed)
Lactation Consult  Mother's reason for visit:  Follow-up weight check for baby. Pumping questions for mom. Visit Type:  OP Appointment Notes:  Mom has decided to pump and bottle feed.  She is pumping an adequate amount of milk but is having difficulty fully softening her breasts and also is experiencing nipple pain.  I changed her flange size to a #27 and she reported increased comfort during expression and that her breasts felt much better after pumping. Towanda OctaveKaidyn is being fed 4-6 ounces per feeding.  I discussed paced feeding with the parents and also asked them to feed her no more than 4 oz per feeding.  I made them aware that she will need to have more feedings but that it was better than overfilling her stomach at one feeding. Parents understood and agreed with this. Follow-up as needed. Consult:  Follow-Up Lactation Consultant:  Soyla DryerJoseph, Derisha Funderburke  ________________________________________________________________________ Joan FloresBaby's Name: Addison LankKaidyn Giovanetti Date of Birth: 09/30/2014 Pediatrician: Orvan FalconerKieffer Gender: female Gestational Age: 673w0d (At Birth) Birth Weight: 7 lb 11.1 oz (3490 g) Weight at Discharge: Weight: 7 lb 7.9 oz (3400 g)[facility]   [facility] 1915 10/02/14 0028  Weight: 7 lb 11.1 oz (3490 g) 7 lb 7.9 oz (3400 g)   Weight  7 days ago 7# 10.5 oz (10/08/14) Weight today:        8# 0.5 oz   ________________________________________________________________________  Mother's Name: Amellia Michelini Type of delivery:   Breastfeeding Experience:  First baby Maternal Medical Conditions:  none Maternal Medications:  PNV, Iron  ________________________________________________________________________  Voids:  6 in 24 hrs.  Color:  Clear yellow Stools:  2-3 in 24 hrs.  Color:  Yellow  ________________________________________________________________________  Maternal Breast Assessment  Breast:   Full   _______________________________________________________________________

## 2017-10-18 NOTE — L&D Delivery Note (Signed)
Delivery Note At 10:57 PM a viable female was delivered via Vaginal, Spontaneous OA Presentation  APGAR: 8, 9; weight   pending.   Placenta status:spontaneously intact  , .  Cord:  with the following complications: loose nuchal cord .  Cord pH: not done  Anesthesia: epidural  Episiotomy: None Lacerations: None Suture Repair: 3.0 chromic Est. Blood Loss (mL): 300  Mom to postpartum.  Baby to Couplet care / Skin to Skin.  Trivia Heffelfinger L 07/01/2018, 11:15 PM

## 2017-12-15 LAB — OB RESULTS CONSOLE HEPATITIS B SURFACE ANTIGEN: Hepatitis B Surface Ag: NEGATIVE

## 2017-12-15 LAB — OB RESULTS CONSOLE GC/CHLAMYDIA
Chlamydia: NEGATIVE
Gonorrhea: NEGATIVE

## 2017-12-15 LAB — OB RESULTS CONSOLE ABO/RH: RH Type: POSITIVE

## 2017-12-15 LAB — OB RESULTS CONSOLE RUBELLA ANTIBODY, IGM: Rubella: IMMUNE

## 2017-12-15 LAB — OB RESULTS CONSOLE HIV ANTIBODY (ROUTINE TESTING): HIV: NONREACTIVE

## 2017-12-15 LAB — OB RESULTS CONSOLE ANTIBODY SCREEN: Antibody Screen: NEGATIVE

## 2017-12-15 LAB — OB RESULTS CONSOLE RPR: RPR: NONREACTIVE

## 2018-06-23 ENCOUNTER — Encounter (HOSPITAL_COMMUNITY): Payer: Self-pay | Admitting: *Deleted

## 2018-06-23 ENCOUNTER — Telehealth (HOSPITAL_COMMUNITY): Payer: Self-pay | Admitting: *Deleted

## 2018-06-23 NOTE — Telephone Encounter (Signed)
Preadmission screen  

## 2018-06-26 ENCOUNTER — Encounter (HOSPITAL_COMMUNITY): Payer: Self-pay | Admitting: *Deleted

## 2018-07-01 ENCOUNTER — Inpatient Hospital Stay (HOSPITAL_COMMUNITY): Payer: Managed Care, Other (non HMO) | Admitting: Anesthesiology

## 2018-07-01 ENCOUNTER — Encounter (HOSPITAL_COMMUNITY): Payer: Self-pay | Admitting: *Deleted

## 2018-07-01 ENCOUNTER — Inpatient Hospital Stay (HOSPITAL_COMMUNITY)
Admission: AD | Admit: 2018-07-01 | Discharge: 2018-07-03 | DRG: 807 | Disposition: A | Discharge status: Home / Self Care | Payer: Managed Care, Other (non HMO) | Attending: Obstetrics and Gynecology | Admitting: Obstetrics and Gynecology

## 2018-07-01 ENCOUNTER — Other Ambulatory Visit: Payer: Self-pay

## 2018-07-01 DIAGNOSIS — Z3483 Encounter for supervision of other normal pregnancy, third trimester: Secondary | ICD-10-CM | POA: Diagnosis present

## 2018-07-01 DIAGNOSIS — Z3A38 38 weeks gestation of pregnancy: Secondary | ICD-10-CM | POA: Diagnosis not present

## 2018-07-01 LAB — TYPE AND SCREEN
ABO/RH(D): O POS
ANTIBODY SCREEN: NEGATIVE

## 2018-07-01 LAB — CBC
HEMATOCRIT: 37.6 % (ref 36.0–46.0)
HEMOGLOBIN: 11.8 g/dL — AB (ref 12.0–15.0)
MCH: 29.1 pg (ref 26.0–34.0)
MCHC: 31.4 g/dL (ref 30.0–36.0)
MCV: 92.6 fL (ref 78.0–100.0)
Platelets: 282 10*3/uL (ref 150–400)
RBC: 4.06 MIL/uL (ref 3.87–5.11)
RDW: 13.3 % (ref 11.5–15.5)
WBC: 14 10*3/uL — ABNORMAL HIGH (ref 4.0–10.5)

## 2018-07-01 MED ORDER — OXYTOCIN 10 UNIT/ML IJ SOLN
INTRAMUSCULAR | Status: AC
  Filled 2018-07-01: qty 1

## 2018-07-01 MED ORDER — SOD CITRATE-CITRIC ACID 500-334 MG/5ML PO SOLN: 30.0000 mL | ORAL | Status: DC | PRN

## 2018-07-01 MED ORDER — OXYTOCIN 40 UNITS IN LACTATED RINGERS INFUSION - SIMPLE MED
2.5000 [IU]/h | INTRAVENOUS | Status: DC
  Administered 2018-07-01: 2.5 [IU]/h via INTRAVENOUS

## 2018-07-01 MED ORDER — LIDOCAINE HCL (PF) 1 % IJ SOLN
30.0000 mL | INTRAMUSCULAR | Status: DC | PRN
  Filled 2018-07-01: qty 30

## 2018-07-01 MED ORDER — DIPHENHYDRAMINE HCL 50 MG/ML IJ SOLN: 12.5000 mg | INTRAMUSCULAR | Status: DC | PRN

## 2018-07-01 MED ORDER — PHENYLEPHRINE 40 MCG/ML (10ML) SYRINGE FOR IV PUSH (FOR BLOOD PRESSURE SUPPORT)
80.0000 ug | PREFILLED_SYRINGE | INTRAVENOUS | Status: DC | PRN
  Filled 2018-07-01: qty 5

## 2018-07-01 MED ORDER — LACTATED RINGERS IV SOLN
INTRAVENOUS | Status: DC
  Administered 2018-07-01: 21:00:00 via INTRAVENOUS

## 2018-07-01 MED ORDER — FENTANYL 2.5 MCG/ML BUPIVACAINE 1/10 % EPIDURAL INFUSION (WH - ANES)
INTRAMUSCULAR | Status: AC
  Filled 2018-07-01: qty 100

## 2018-07-01 MED ORDER — LACTATED RINGERS IV SOLN
500.0000 mL | Freq: Once | INTRAVENOUS | Status: AC
  Administered 2018-07-01: 500 mL via INTRAVENOUS

## 2018-07-01 MED ORDER — OXYCODONE-ACETAMINOPHEN 5-325 MG PO TABS: 1.0000 | ORAL_TABLET | ORAL | Status: DC | PRN

## 2018-07-01 MED ORDER — SODIUM BICARBONATE 8.4 % IV SOLN
INTRAVENOUS | Status: DC | PRN
  Administered 2018-07-01 (×2): 4 mL via EPIDURAL

## 2018-07-01 MED ORDER — PHENYLEPHRINE 40 MCG/ML (10ML) SYRINGE FOR IV PUSH (FOR BLOOD PRESSURE SUPPORT)
PREFILLED_SYRINGE | INTRAVENOUS | Status: AC
  Filled 2018-07-01: qty 10

## 2018-07-01 MED ORDER — EPHEDRINE 5 MG/ML INJ
10.0000 mg | INTRAVENOUS | Status: DC | PRN
  Filled 2018-07-01: qty 2

## 2018-07-01 MED ORDER — FLEET ENEMA 7-19 GM/118ML RE ENEM: 1.0000 | ENEMA | RECTAL | Status: DC | PRN

## 2018-07-01 MED ORDER — ACETAMINOPHEN 325 MG PO TABS: 650.0000 mg | ORAL_TABLET | ORAL | Status: DC | PRN

## 2018-07-01 MED ORDER — OXYTOCIN 40 UNITS IN LACTATED RINGERS INFUSION - SIMPLE MED
INTRAVENOUS | Status: AC
  Administered 2018-07-01: 500 mL via INTRAVENOUS
  Filled 2018-07-01: qty 1000

## 2018-07-01 MED ORDER — ONDANSETRON HCL 4 MG/2ML IJ SOLN: 4.0000 mg | Freq: Four times a day (QID) | INTRAMUSCULAR | Status: DC | PRN

## 2018-07-01 MED ORDER — LACTATED RINGERS IV SOLN: 500.0000 mL | INTRAVENOUS | Status: DC | PRN

## 2018-07-01 MED ORDER — OXYTOCIN BOLUS FROM INFUSION
500.0000 mL | Freq: Once | INTRAVENOUS | Status: AC
  Administered 2018-07-01: 500 mL via INTRAVENOUS

## 2018-07-01 MED ORDER — OXYCODONE-ACETAMINOPHEN 5-325 MG PO TABS: 2.0000 | ORAL_TABLET | ORAL | Status: DC | PRN

## 2018-07-01 MED ORDER — FENTANYL 2.5 MCG/ML BUPIVACAINE 1/10 % EPIDURAL INFUSION (WH - ANES)
14.0000 mL/h | INTRAMUSCULAR | Status: DC | PRN
  Administered 2018-07-01: 14 mL/h via EPIDURAL

## 2018-07-01 NOTE — Anesthesia Procedure Notes (Signed)
Epidural Patient location during procedure: OB Start time: 07/01/2018 7:50 PM End time: 07/01/2018 8:05 PM  Staffing Anesthesiologist: Elmer PickerWoodrum, Wyatt Thorstenson L, MD Performed: anesthesiologist   Preanesthetic Checklist Completed: patient identified, pre-op evaluation, timeout performed, IV checked, risks and benefits discussed and monitors and equipment checked  Epidural Patient position: sitting Prep: site prepped and draped and DuraPrep Patient monitoring: continuous pulse ox, blood pressure, heart rate and cardiac monitor Approach: midline Location: L3-L4 Injection technique: LOR air  Needle:  Needle type: Tuohy  Needle gauge: 17 G Needle length: 9 cm Needle insertion depth: 5 cm Catheter type: closed end flexible Catheter size: 19 Gauge Catheter at skin depth: 10 cm Test dose: negative  Assessment Sensory level: T8 Events: blood not aspirated, injection not painful, no injection resistance, negative IV test and no paresthesia  Additional Notes Patient identified. Risks/Benefits/Options discussed with patient including but not limited to bleeding, infection, nerve damage, paralysis, failed block, incomplete pain control, headache, blood pressure changes, nausea, vomiting, reactions to medication both or allergic, itching and postpartum back pain. Confirmed with bedside nurse the patient's most recent platelet count. Confirmed with patient that they are not currently taking any anticoagulation, have any bleeding history or any family history of bleeding disorders. Patient expressed understanding and wished to proceed. All questions were answered. Sterile technique was used throughout the entire procedure. Please see nursing notes for vital signs. Test dose was given through epidural catheter and negative prior to continuing to dose epidural or start infusion. Warning signs of high block given to the patient including shortness of breath, tingling/numbness in hands, complete motor block,  or any concerning symptoms with instructions to call for help. Patient was given instructions on fall risk and not to get out of bed. All questions and concerns addressed with instructions to call with any issues or inadequate analgesia.  Reason for block:procedure for pain

## 2018-07-01 NOTE — Anesthesia Preprocedure Evaluation (Signed)
Anesthesia Evaluation  Patient identified by MRN, date of birth, ID band Patient awake    Reviewed: Allergy & Precautions, NPO status , Patient's Chart, lab work & pertinent test results  Airway Mallampati: I  TM Distance: >3 FB Neck ROM: Full    Dental no notable dental hx. (+) Teeth Intact   Pulmonary neg pulmonary ROS,    Pulmonary exam normal breath sounds clear to auscultation       Cardiovascular negative cardio ROS Normal cardiovascular exam Rhythm:Regular Rate:Normal     Neuro/Psych negative neurological ROS  negative psych ROS   GI/Hepatic negative GI ROS, Neg liver ROS,   Endo/Other  negative endocrine ROS  Renal/GU negative Renal ROS  negative genitourinary   Musculoskeletal negative musculoskeletal ROS (+)   Abdominal   Peds  Hematology negative hematology ROS (+)   Anesthesia Other Findings   Reproductive/Obstetrics (+) Pregnancy                             Anesthesia Physical Anesthesia Plan  ASA: II  Anesthesia Plan: Epidural   Post-op Pain Management:    Induction:   PONV Risk Score and Plan: Treatment may vary due to age or medical condition  Airway Management Planned: Natural Airway  Additional Equipment:   Intra-op Plan:   Post-operative Plan:   Informed Consent: I have reviewed the patients History and Physical, chart, labs and discussed the procedure including the risks, benefits and alternatives for the proposed anesthesia with the patient or authorized representative who has indicated his/her understanding and acceptance.       Plan Discussed with: Anesthesiologist  Anesthesia Plan Comments: (Patient identified. Risks, benefits, options discussed with patient including but not limited to bleeding, infection, nerve damage, paralysis, failed block, incomplete pain control, headache, blood pressure changes, nausea, vomiting, reactions to medication,  itching, and post partum back pain. Confirmed with bedside nurse the patient's most recent platelet count. Confirmed with the patient that they are not taking any anticoagulation, have any bleeding history or any family history of bleeding disorders. Patient expressed understanding and wishes to proceed. All questions were answered. )        Anesthesia Quick Evaluation  

## 2018-07-01 NOTE — MAU Note (Signed)
Ctxs since 1800. Bloody show. 4.5cm last sve. Denies LOF

## 2018-07-01 NOTE — H&P (Signed)
Janet Hood is a 32 y.o.G 2 P 1 at 38 w 3 days presents in labor.  OB History    Gravida  2   Para  1   Term  1   Preterm  0   AB  0   Living  1     SAB  0   TAB  0   Ectopic  0   Multiple  0   Live Births  1          Past Medical History:  Diagnosis Date  . Medical history non-contributory    Past Surgical History:  Procedure Laterality Date  . NO PAST SURGERIES    . WISDOM TOOTH EXTRACTION     Family History: family history includes Heart attack in her father. Social History:  reports that she has never smoked. She has never used smokeless tobacco. She reports that she drinks about 1.0 standard drinks of alcohol per week. She reports that she does not use drugs.     Maternal Diabetes: No Genetic Screening: Normal Maternal Ultrasounds/Referrals: Normal Fetal Ultrasounds or other Referrals:  None Maternal Substance Abuse:  No Significant Maternal Medications:  None Significant Maternal Lab Results:  None Other Comments:  None  Review of Systems  All other systems reviewed and are negative.  Maternal Medical History:  Reason for admission: Contractions.     Dilation: 7 Effacement (%): 90 Station: -1 Exam by:: Lauren Cox RN  Blood pressure 113/77, pulse 95, temperature 98.5 F (36.9 C), temperature source Oral, resp. rate 16, height 5\' 9"  (1.753 m), weight 68 kg, last menstrual period 10/05/2017, unknown if currently breastfeeding. Maternal Exam:  Abdomen: Fetal presentation: vertex     Fetal Exam Fetal State Assessment: Category I - tracings are normal.     Physical Exam  Nursing note and vitals reviewed. Constitutional: She appears well-developed and well-nourished.  HENT:  Head: Normocephalic and atraumatic.  Eyes: Pupils are equal, round, and reactive to light.  Neck: Normal range of motion.  Cardiovascular: Normal rate and regular rhythm.    Prenatal labs: ABO, Rh: --/--/O POS (09/14 2130) Antibody: PENDING (09/14  2130) Rubella: Immune (02/28 0000) RPR: Nonreactive (02/28 0000)  HBsAg: Negative (02/28 0000)  HIV: Non-reactive (02/28 0000)  GBS:     Assessment/Plan: IUP at 38 w 3 days Labor Anticipate NSVD   [place] L 07/01/2018, 10:01 PM

## 2018-07-02 ENCOUNTER — Encounter (HOSPITAL_COMMUNITY): Payer: Self-pay

## 2018-07-02 LAB — CBC
HEMATOCRIT: 32.5 % — AB (ref 36.0–46.0)
HEMOGLOBIN: 10.4 g/dL — AB (ref 12.0–15.0)
MCH: 29.4 pg (ref 26.0–34.0)
MCHC: 32 g/dL (ref 30.0–36.0)
MCV: 91.8 fL (ref 78.0–100.0)
Platelets: 238 10*3/uL (ref 150–400)
RBC: 3.54 MIL/uL — ABNORMAL LOW (ref 3.87–5.11)
RDW: 13.2 % (ref 11.5–15.5)
WBC: 20.2 10*3/uL — ABNORMAL HIGH (ref 4.0–10.5)

## 2018-07-02 LAB — RPR: RPR: NONREACTIVE

## 2018-07-02 MED ORDER — DIPHENHYDRAMINE HCL 25 MG PO CAPS: 25.0000 mg | ORAL_CAPSULE | Freq: Four times a day (QID) | ORAL | Status: DC | PRN

## 2018-07-02 MED ORDER — ONDANSETRON HCL 4 MG/2ML IJ SOLN: 4.0000 mg | INTRAMUSCULAR | Status: DC | PRN

## 2018-07-02 MED ORDER — PRENATAL MULTIVITAMIN CH
1.0000 | ORAL_TABLET | Freq: Every day | ORAL | Status: DC
  Administered 2018-07-02 – 2018-07-03 (×2): 1 via ORAL
  Filled 2018-07-02 (×2): qty 1

## 2018-07-02 MED ORDER — ONDANSETRON HCL 4 MG PO TABS: 4.0000 mg | ORAL_TABLET | ORAL | Status: DC | PRN

## 2018-07-02 MED ORDER — COCONUT OIL OIL: 1.0000 "application " | TOPICAL_OIL | Status: DC | PRN

## 2018-07-02 MED ORDER — MEDROXYPROGESTERONE ACETATE 150 MG/ML IM SUSP: 150.0000 mg | INTRAMUSCULAR | Status: DC | PRN

## 2018-07-02 MED ORDER — ACETAMINOPHEN 325 MG PO TABS
650.0000 mg | ORAL_TABLET | ORAL | Status: DC | PRN
  Administered 2018-07-02: 650 mg via ORAL
  Filled 2018-07-02: qty 2

## 2018-07-02 MED ORDER — BISACODYL 10 MG RE SUPP: 10.0000 mg | Freq: Every day | RECTAL | Status: DC | PRN

## 2018-07-02 MED ORDER — MEASLES, MUMPS & RUBELLA VAC ~~LOC~~ INJ
0.5000 mL | INJECTION | Freq: Once | SUBCUTANEOUS | Status: DC
  Filled 2018-07-02: qty 0.5

## 2018-07-02 MED ORDER — BENZOCAINE-MENTHOL 20-0.5 % EX AERO
1.0000 "application " | INHALATION_SPRAY | CUTANEOUS | Status: DC | PRN
  Administered 2018-07-02: 1 via TOPICAL
  Filled 2018-07-02: qty 56

## 2018-07-02 MED ORDER — DIBUCAINE 1 % RE OINT: 1.0000 "application " | TOPICAL_OINTMENT | RECTAL | Status: DC | PRN

## 2018-07-02 MED ORDER — SIMETHICONE 80 MG PO CHEW: 80.0000 mg | CHEWABLE_TABLET | ORAL | Status: DC | PRN

## 2018-07-02 MED ORDER — IBUPROFEN 600 MG PO TABS
600.0000 mg | ORAL_TABLET | Freq: Four times a day (QID) | ORAL | Status: DC
  Administered 2018-07-02 – 2018-07-03 (×6): 600 mg via ORAL
  Filled 2018-07-02 (×6): qty 1

## 2018-07-02 MED ORDER — WITCH HAZEL-GLYCERIN EX PADS: 1.0000 "application " | MEDICATED_PAD | CUTANEOUS | Status: DC | PRN

## 2018-07-02 MED ORDER — ZOLPIDEM TARTRATE 5 MG PO TABS: 5.0000 mg | ORAL_TABLET | Freq: Every evening | ORAL | Status: DC | PRN

## 2018-07-02 MED ORDER — SENNOSIDES-DOCUSATE SODIUM 8.6-50 MG PO TABS
2.0000 | ORAL_TABLET | ORAL | Status: DC
  Administered 2018-07-02 (×2): 2 via ORAL
  Filled 2018-07-02 (×2): qty 2

## 2018-07-02 MED ORDER — FLEET ENEMA 7-19 GM/118ML RE ENEM: 1.0000 | ENEMA | Freq: Every day | RECTAL | Status: DC | PRN

## 2018-07-02 MED ORDER — TETANUS-DIPHTH-ACELL PERTUSSIS 5-2.5-18.5 LF-MCG/0.5 IM SUSP: 0.5000 mL | Freq: Once | INTRAMUSCULAR | Status: DC

## 2018-07-02 NOTE — Progress Notes (Signed)
Patient doing well. No complaints. BP 104/66   Pulse 71   Temp 97.6 F (36.4 C) (Oral)   Resp 16   Ht 5\' 9"  (1.753 m)   Wt 68 kg   LMP 10/05/2017   SpO2 100%   Breastfeeding? Unknown   BMI 22.14 kg/m  Results for orders placed or performed during the hospital encounter of 07/01/18 (from the past 24 hour(s))  CBC     Status: Abnormal   Collection Time: 07/01/18  9:30 PM  Result Value Ref Range   WBC 14.0 (H) 4.0 - 10.5 K/uL   RBC 4.06 3.87 - 5.11 MIL/uL   Hemoglobin 11.8 (L) 12.0 - 15.0 g/dL   HCT 69.637.6 29.536.0 - 28.446.0 %   MCV 92.6 78.0 - 100.0 fL   MCH 29.1 26.0 - 34.0 pg   MCHC 31.4 30.0 - 36.0 g/dL   RDW 13.213.3 44.011.5 - 10.215.5 %   Platelets 282 150 - 400 K/uL  Type and screen [facility]     Status: None   Collection Time: 07/01/18  9:30 PM  Result Value Ref Range   ABO/RH(D) O POS    Antibody Screen NEG    Sample Expiration      07/04/2018 Performed at [facility], [address] [place], [place] [address]   CBC     Status: Abnormal   Collection Time: 07/02/18  5:34 AM  Result Value Ref Range   WBC 20.2 (H) 4.0 - 10.5 K/uL   RBC 3.54 (L) 3.87 - 5.11 MIL/uL   Hemoglobin 10.4 (L) 12.0 - 15.0 g/dL   HCT 64.432.5 (L) 03.436.0 - 74.246.0 %   MCV 91.8 78.0 - 100.0 fL   MCH 29.4 26.0 - 34.0 pg   MCHC 32.0 30.0 - 36.0 g/dL   RDW 59.513.2 63.811.5 - 75.615.5 %   Platelets 238 150 - 400 K/uL   Abdomen is soft and non tender Lochia WNL  PPD # 1  Doing well Routine care circ today  Discharge home tomorrow

## 2018-07-02 NOTE — Anesthesia Postprocedure Evaluation (Signed)
Anesthesia Post Note  Patient: Janet Hood  Procedure(s) Performed: AN AD HOC LABOR EPIDURAL     Patient location during evaluation: Mother Baby Anesthesia Type: Epidural Level of consciousness: awake and alert Pain management: pain level controlled Vital Signs Assessment: post-procedure vital signs reviewed and stable Respiratory status: spontaneous breathing, nonlabored ventilation and respiratory function stable Cardiovascular status: stable Postop Assessment: no headache, no backache, epidural receding, able to ambulate, adequate PO intake, no apparent nausea or vomiting and patient able to bend at knees Anesthetic complications: no    Last Vitals:  Vitals:   07/02/18 0150 07/02/18 0516  BP: 98/60 104/66  Pulse: 70 71  Resp: 16 16  Temp: 36.8 C 36.4 C  SpO2: 100%     Last Pain:  Vitals:   07/02/18 0516  TempSrc: Oral  PainSc:    Pain Goal: Patients Stated Pain Goal: 0 (07/01/18 2057)               Donnalee CurryMalinova,Luann Aspinwall Hristova

## 2018-07-03 MED ORDER — IBUPROFEN 600 MG PO TABS: 600.0000 mg | ORAL_TABLET | Freq: Four times a day (QID) | ORAL | 1 refills | Status: AC | PRN

## 2018-07-03 MED ORDER — ACETAMINOPHEN 325 MG PO TABS: 650.0000 mg | ORAL_TABLET | Freq: Four times a day (QID) | ORAL | 1 refills | Status: AC | PRN

## 2018-07-03 NOTE — Lactation Note (Signed)
This note was copied from a baby's chart. Lactation Consultation Note  Patient Name: Boy Johnsie CancelDeidra Monda Today's Date: 07/03/2018 Reason for consult: Initial assessment;Early term 37-38.6wks P2, 26 hour ETI female infant. Per mom has medela  DEBP. [facility] Mom latched infant in cradle hold on right breast, swallows observed and LC discussed with mom importance of deep latch and wait for infant to  open his mouth wide with lower jaw extended. Mom was still feeding infant as LC left room. LC notice mom has abrasion and nipple strip on breast, mom reports nipples are sore.LC gave mom comfort gels. LC discussed with parents infant behaviors and feeding guidelines for ETI infant, Mom will BF and give infant back EBM after feedings or supplement w/ formula if need. LC suggested to  mom to pump mom informed LC she brought her pump from home. LC advised mom not past 3 hours without  feeding infant, limit feedings 30 mins or less due infant being ETI. LC discussed I&O.  Mom encouraged to feed baby 8-12 times/24 hours and with feeding cues.  LC discussed : BF support group, LC hotline, LC outpatient Clinics and BF resources within the local community.    Maternal Data Formula Feeding for Exclusion: No Has patient been taught Hand Expression?: Yes([facility] Does the patient have breastfeeding experience prior to this delivery?: Yes  Feeding Feeding Type: Breast Fed Length of feed: 10 min(still BF as LC left room.)  LATCH Score Latch: Grasps breast easily, tongue down, lips flanged, rhythmical sucking.  Audible Swallowing: Spontaneous and intermittent  Type of Nipple: Everted at rest and after stimulation  Comfort (Breast/Nipple): Filling, red/small blisters or bruises, mild/mod discomfort  Hold (Positioning): No assistance needed to correctly position infant at breast.  LATCH Score: 9  Interventions Interventions: Breast feeding basics  reviewed;Support pillows;Assisted with latch;Position options;Hand express;Comfort gels  Lactation Tools Discussed/Used Tools: Comfort gels WIC Program: No   Consult Status      Danelle EarthlyRobin Jakeim Sedore 07/03/2018, 1:00 AM

## 2018-07-03 NOTE — Discharge Summary (Signed)
Obstetric Discharge Summary Reason for Admission: onset of labor Prenatal Procedures: none Intrapartum Procedures: spontaneous vaginal delivery Postpartum Procedures: none Complications-Operative and Postpartum: none Hemoglobin  Date Value Ref Range Status  07/02/2018 10.4 (L) 12.0 - 15.0 g/dL Final   HCT  Date Value Ref Range Status  07/02/2018 32.5 (L) 36.0 - 46.0 % Final    Physical Exam:  General: alert, cooperative and no distress Lochia: appropriate Uterine Fundus: firm Incision: healing well DVT Evaluation: No evidence of DVT seen on physical exam.  Discharge Diagnoses: Term Pregnancy-delivered  Discharge Information: Date: 07/03/2018 Activity: pelvic rest Diet: routine Medications: PNV, Ibuprofen and tylenol Condition: stable Instructions: refer to practice specific booklet Discharge to: home   Newborn Data: Live born female  Birth Weight: 6 lb 14.2 oz (3124 g) APGAR: 8, 9  Newborn Delivery   Birth date/time:  07/01/2018 22:57:00 Delivery type:  Vaginal, Spontaneous     Home with mother.  Janet LocusJames Hood Janet Hood 07/03/2018, 8:37 AM

## 2018-07-03 NOTE — Lactation Note (Signed)
This note was copied from a baby's chart. Lactation Consultation Note  Patient Name: Janet Hood [id number]'s Date: 07/03/2018 Reason for consult: Follow-up assessment Mom reports that she plans on pumping and bottle feeding when she gets home as she did with her last baby.  She states she had a good supply.  Instructed to pump 8-12 times in 24 hours.  Mom denies questions or concerns.  Lactation outpatient services and support reviewed and encouraged prn.  Maternal Data    Feeding Feeding Type: Breast Fed Length of feed: 30 min  LATCH Score                   Interventions    Lactation Tools Discussed/Used     Consult Status Consult Status: Complete    Huston FoleyMOULDEN, Alivya Wegman S 07/03/2018, 11:52 AM

## 2018-07-03 NOTE — Progress Notes (Signed)
Mom's temps have been running  low: 97.4-97.6.  All other vital signs are WNL. Up and walking, good appetite, breastfeeding, alert.

## 2018-07-06 ENCOUNTER — Inpatient Hospital Stay (HOSPITAL_COMMUNITY): Admission: RE | Admit: 2018-07-06 | Payer: Managed Care, Other (non HMO) | Source: Ambulatory Visit
